# Patient Record
Sex: Male | Born: 2005 | Race: White | Hispanic: No | Marital: Single | Smoking: Never smoker
Health system: Southern US, Community
[De-identification: ages and names within clinical notes are randomized; demographics above are authoritative.]

## PROBLEM LIST (undated history)

## (undated) DIAGNOSIS — F909 Attention-deficit hyperactivity disorder, unspecified type: Secondary | ICD-10-CM

---
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
## 2023-10-03 ENCOUNTER — Emergency Department (HOSPITAL_COMMUNITY)
Admission: EM | Admit: 2023-10-03 | Discharge: 2023-10-03 | Disposition: A | Discharge status: Home / Self Care | Attending: Emergency Medicine | Admitting: Emergency Medicine

## 2023-10-03 ENCOUNTER — Other Ambulatory Visit: Payer: Self-pay

## 2023-10-03 ENCOUNTER — Emergency Department (HOSPITAL_COMMUNITY)

## 2023-10-03 ENCOUNTER — Encounter (HOSPITAL_COMMUNITY): Payer: Self-pay | Admitting: Emergency Medicine

## 2023-10-03 DIAGNOSIS — R251 Tremor, unspecified: Secondary | ICD-10-CM | POA: Insufficient documentation

## 2023-10-03 DIAGNOSIS — R21 Rash and other nonspecific skin eruption: Secondary | ICD-10-CM | POA: Insufficient documentation

## 2023-10-03 DIAGNOSIS — M79621 Pain in right upper arm: Secondary | ICD-10-CM | POA: Insufficient documentation

## 2023-10-03 DIAGNOSIS — M79604 Pain in right leg: Secondary | ICD-10-CM | POA: Diagnosis not present

## 2023-10-03 DIAGNOSIS — T754XXA Electrocution, initial encounter: Secondary | ICD-10-CM | POA: Diagnosis present

## 2023-10-03 LAB — CBC WITH DIFFERENTIAL/PLATELET
Abs Immature Granulocytes: 0.04 K/uL (ref 0.00–0.07)
Basophils Absolute: 0 K/uL (ref 0.0–0.1)
Basophils Relative: 0 %
Eosinophils Absolute: 0.1 K/uL (ref 0.0–0.5)
Eosinophils Relative: 2 %
HCT: 46.6 % (ref 39.0–52.0)
Hemoglobin: 15.5 g/dL (ref 13.0–17.0)
Immature Granulocytes: 1 %
Lymphocytes Relative: 33 %
Lymphs Abs: 2 K/uL (ref 0.7–4.0)
MCH: 28.3 pg (ref 26.0–34.0)
MCHC: 33.3 g/dL (ref 30.0–36.0)
MCV: 85 fL (ref 80.0–100.0)
Monocytes Absolute: 0.5 K/uL (ref 0.1–1.0)
Monocytes Relative: 9 %
Neutro Abs: 3.4 K/uL (ref 1.7–7.7)
Neutrophils Relative %: 55 %
Platelets: 203 K/uL (ref 150–400)
RBC: 5.48 MIL/uL (ref 4.22–5.81)
RDW: 12.3 % (ref 11.5–15.5)
WBC: 6.2 K/uL (ref 4.0–10.5)
nRBC: 0 % (ref 0.0–0.2)

## 2023-10-03 LAB — COMPREHENSIVE METABOLIC PANEL WITH GFR
ALT: 27 U/L (ref 0–44)
AST: 26 U/L (ref 15–41)
Albumin: 4.6 g/dL (ref 3.5–5.0)
Alkaline Phosphatase: 82 U/L (ref 38–126)
Anion gap: 12 (ref 5–15)
BUN: 17 mg/dL (ref 6–20)
CO2: 24 mmol/L (ref 22–32)
Calcium: 10 mg/dL (ref 8.9–10.3)
Chloride: 101 mmol/L (ref 98–111)
Creatinine, Ser: 0.91 mg/dL (ref 0.61–1.24)
GFR, Estimated: >60 mL/min (ref >60)
Glucose, Bld: 109 mg/dL — ABNORMAL HIGH (ref 70–99)
Potassium: 4.2 mmol/L (ref 3.5–5.1)
Sodium: 137 mmol/L (ref 135–145)
Total Bilirubin: 0.8 mg/dL (ref 0.0–1.2)
Total Protein: 8.3 g/dL — ABNORMAL HIGH (ref 6.5–8.1)

## 2023-10-03 LAB — CK: Total CK: 175 U/L (ref 49–397)

## 2023-10-03 MED ORDER — OXYCODONE-ACETAMINOPHEN 5-325 MG PO TABS: 1.0000 | ORAL_TABLET | Freq: Two times a day (BID) | ORAL | 0 refills | Status: DC | PRN

## 2023-10-03 MED ORDER — OXYCODONE-ACETAMINOPHEN 5-325 MG PO TABS
1.0000 | ORAL_TABLET | Freq: Once | ORAL | Status: AC
  Administered 2023-10-03: 1 via ORAL
  Filled 2023-10-03: qty 1

## 2023-10-03 MED ORDER — IBUPROFEN 600 MG PO TABS: 600.0000 mg | ORAL_TABLET | Freq: Four times a day (QID) | ORAL | 0 refills | Status: AC | PRN

## 2023-10-03 MED ORDER — LACTATED RINGERS IV BOLUS
1000.0000 mL | Freq: Once | INTRAVENOUS | Status: AC
  Administered 2023-10-03: 1000 mL via INTRAVENOUS

## 2023-10-03 MED ORDER — MORPHINE SULFATE (PF) 4 MG/ML IV SOLN: 6.0000 mg | INTRAVENOUS | Status: DC | PRN

## 2023-10-03 MED ORDER — BACITRACIN ZINC 500 UNIT/GM EX OINT: 1.0000 | TOPICAL_OINTMENT | Freq: Two times a day (BID) | CUTANEOUS | 0 refills | Status: AC
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
## 2023-10-03 NOTE — ED Notes (Signed)
 Pt care taken, waiting for results of x-rays, no other complaints.
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
## 2024-01-19 ENCOUNTER — Encounter (HOSPITAL_COMMUNITY): Payer: Self-pay | Admitting: Emergency Medicine

## 2024-01-19 ENCOUNTER — Emergency Department (HOSPITAL_COMMUNITY)

## 2024-01-19 ENCOUNTER — Emergency Department (HOSPITAL_COMMUNITY)
Admission: EM | Admit: 2024-01-19 | Discharge: 2024-01-19 | Disposition: A | Discharge status: Home / Self Care | Attending: Emergency Medicine | Admitting: Emergency Medicine

## 2024-01-19 ENCOUNTER — Other Ambulatory Visit: Payer: Self-pay

## 2024-01-19 DIAGNOSIS — W312XXA Contact with powered woodworking and forming machines, initial encounter: Secondary | ICD-10-CM | POA: Insufficient documentation

## 2024-01-19 DIAGNOSIS — S6992XA Unspecified injury of left wrist, hand and finger(s), initial encounter: Secondary | ICD-10-CM | POA: Diagnosis present

## 2024-01-19 DIAGNOSIS — Y93L1 Activity, splitting wood: Secondary | ICD-10-CM | POA: Insufficient documentation

## 2024-01-19 DIAGNOSIS — S62522B Displaced fracture of distal phalanx of left thumb, initial encounter for open fracture: Secondary | ICD-10-CM | POA: Insufficient documentation

## 2024-01-19 DIAGNOSIS — M7989 Other specified soft tissue disorders: Secondary | ICD-10-CM | POA: Insufficient documentation

## 2024-01-19 DIAGNOSIS — S68522A Partial traumatic transphalangeal amputation of left thumb, initial encounter: Secondary | ICD-10-CM

## 2024-01-19 DIAGNOSIS — S62523B Displaced fracture of distal phalanx of unspecified thumb, initial encounter for open fracture: Secondary | ICD-10-CM

## 2024-01-19 LAB — CBC
HCT: 45.9 % (ref 39.0–52.0)
Hemoglobin: 15.6 g/dL (ref 13.0–17.0)
MCH: 27.9 pg (ref 26.0–34.0)
MCHC: 34 g/dL (ref 30.0–36.0)
MCV: 82 fL (ref 80.0–100.0)
Platelets: 186 K/uL (ref 150–400)
RBC: 5.6 MIL/uL (ref 4.22–5.81)
RDW: 13 % (ref 11.5–15.5)
WBC: 8.7 K/uL (ref 4.0–10.5)
nRBC: 0 % (ref 0.0–0.2)

## 2024-01-19 LAB — BASIC METABOLIC PANEL WITH GFR
Anion gap: 14 (ref 5–15)
BUN: 20 mg/dL (ref 6–20)
CO2: 21 mmol/L — ABNORMAL LOW (ref 22–32)
Calcium: 9 mg/dL (ref 8.9–10.3)
Chloride: 104 mmol/L (ref 98–111)
Creatinine, Ser: 0.87 mg/dL (ref 0.61–1.24)
GFR, Estimated: >60 mL/min (ref >60)
Glucose, Bld: 100 mg/dL — ABNORMAL HIGH (ref 70–99)
Potassium: 4 mmol/L (ref 3.5–5.1)
Sodium: 139 mmol/L (ref 135–145)

## 2024-01-19 MED ORDER — POVIDONE-IODINE 10 % EX SOLN
CUTANEOUS | Status: AC
Start: 2024-01-19 — End: 2024-01-19
  Filled 2024-01-19: qty 29.6

## 2024-01-19 MED ORDER — OXYCODONE-ACETAMINOPHEN 5-325 MG PO TABS
1.0000 | ORAL_TABLET | Freq: Once | ORAL | Status: AC
  Administered 2024-01-19: 1 via ORAL
  Filled 2024-01-19: qty 1

## 2024-01-19 MED ORDER — CEPHALEXIN 500 MG PO CAPS: 500.0000 mg | ORAL_CAPSULE | Freq: Four times a day (QID) | ORAL | 0 refills | Status: AC

## 2024-01-19 MED ORDER — HYDROCODONE-ACETAMINOPHEN 5-325 MG PO TABS: 1.0000 | ORAL_TABLET | Freq: Four times a day (QID) | ORAL | 0 refills | Status: DC | PRN

## 2024-01-19 MED ORDER — TETANUS-DIPHTH-ACELL PERTUSSIS 5-2-15.5 LF-MCG/0.5 IM SUSP: 0.5000 mL | Freq: Once | INTRAMUSCULAR | Status: DC

## 2024-01-19 MED ORDER — CEFAZOLIN SODIUM-DEXTROSE 2-4 GM/100ML-% IV SOLN
2.0000 g | Freq: Once | INTRAVENOUS | Status: AC
Start: 2024-01-19 — End: 2024-01-19
  Administered 2024-01-19: 2 g via INTRAVENOUS
  Filled 2024-01-19: qty 100

## 2024-01-19 MED ORDER — LIDOCAINE HCL (PF) 1 % IJ SOLN
20.0000 mL | Freq: Once | INTRAMUSCULAR | Status: AC
  Administered 2024-01-19: 20 mL
  Filled 2024-01-19: qty 20

## 2024-01-19 NOTE — ED Provider Notes (Incomplete)
 [place] EMERGENCY DEPARTMENT AT [facility] Provider Note   CSN: [id number] Arrival date & time: 01/19/24  1637     Patient presents with: Laceration  HPI Martin Potter is a 18 y.o. male present for left laceration.  Occurred around 4:30 PM today.  Patient was using a wood splitter when he cut the tip of my thumb off.  Bleeding controlled at this time.  Endorses in pain but no loss of sensation.  He states that range of motion is still intact as well.    Laceration      Prior to Admission medications   Medication Sig Start Date End Date Taking? Authorizing Provider  cephALEXin (KEFLEX) 500 MG capsule Take 1 capsule (500 mg total) by mouth 4 (four) times daily for 7 days. 01/19/24 01/26/24 Yes Lang, Olga Bourbeau K, PA-C  HYDROcodone-acetaminophen  (NORCO/VICODIN) 5-325 MG tablet Take 1 tablet by mouth every 6 (six) hours as needed for up to 8 doses. 01/19/24  Yes Casten Floren K, PA-C  bacitracin  ointment Apply 1 Application topically 2 (two) times daily. 10/03/23   Charlyn Sora, MD  ibuprofen  (ADVIL ) 600 MG tablet Take 1 tablet (600 mg total) by mouth every 6 (six) hours as needed. 10/03/23   Charlyn Sora, MD  oxyCODONE -acetaminophen  (PERCOCET/ROXICET) 5-325 MG tablet Take 1 tablet by mouth every 12 (twelve) hours as needed for severe pain (pain score 7-10). 10/03/23   Charlyn Sora, MD    Allergies: Patient has no known allergies.    Review of Systems See HPI  Updated Vital Signs BP (!) 140/85   Pulse 95   Temp (!) 97.2 F (36.2 C) (Temporal)   Resp 18   SpO2 99%   Physical Exam Constitutional:      Appearance: Normal appearance.  HENT:     Head: Normocephalic.     Nose: Nose normal.  Eyes:     Conjunctiva/sclera: Conjunctivae normal.  Pulmonary:     Effort: Pulmonary effort is normal.  Musculoskeletal:     Comments: Appears to be partial amputation of the left great thumb.  Distal tip of the thumb appears to be avulsed.  Not actively bleeding.   Able to actively range the thumb at the [facility] joint. See pictures below. Radial pulse is 2+.  Thumbnail is absent but the rim of the proximal nailbed is palpable.  Neurological:     Mental Status: He is alert.  Psychiatric:        Mood and Affect: Mood normal.      Post irrigation and cleaning      (all labs ordered are listed, but only abnormal results are displayed) Labs Reviewed  BASIC METABOLIC PANEL WITH GFR - Abnormal; Notable for the following components:      Result Value   CO2 21 (*)    Glucose, Bld 100 (*)    All other components within normal limits  CBC    EKG: None  Radiology: DG Finger Thumb Left Result Date: 01/19/2024 CLINICAL DATA:  Laceration EXAM: LEFT THUMB 2+V COMPARISON:  None Available. FINDINGS: Partial soft tissue and bony amputation involving the tuft of the first distal phalanx. Acute comminuted fracture involving the mid to distal first distal phalanx with displaced bone fragments. IMPRESSION: Partial soft tissue and bony amputation involving the tuft of the first distal phalanx. Acute comminuted open fracture involving the mid to distal first distal phalanx with displaced bone fragments. Electronically Signed   By: Luke Bun M.D.   On: 01/19/2024 17:34     .  Nerve Block  Date/Time: 01/20/2024 9:13 AM  Performed by: Lang Norleen POUR, PA-C Authorized by: Lang Norleen POUR, PA-C   Consent:    Consent obtained:  Verbal   Consent given by:  Patient   Risks discussed:  Allergic reaction, infection and nerve damage   Alternatives discussed:  No treatment Universal protocol:    Procedure explained and questions answered to patient or proxy's satisfaction: yes     Relevant documents present and verified: yes     Test results available: yes     Patient identity confirmed:  Verbally with patient and arm band Indications:    Indications:  Pain relief and procedural anesthesia Location:    Body area:  Upper extremity   Upper extremity nerve:   Metacarpal (left thumb)   Laterality:  Right Pre-procedure details:    Skin preparation:  Povidone-iodine Skin anesthesia:    Skin anesthesia method:  Local infiltration   Local anesthetic:  Lidocaine 1% w/o epi Procedure details:    Block needle gauge:  25 G   Anesthetic injected:  Lidocaine 1% w/o epi   Steroid injected:  None   Injection procedure:  Anatomic landmarks identified Post-procedure details:    Dressing:  Sterile dressing   Outcome:  Anesthesia achieved   Procedure completion:  Tolerated well, no immediate complications    Medications Ordered in the ED  oxyCODONE -acetaminophen  (PERCOCET/ROXICET) 5-325 MG per tablet 1 tablet (1 tablet Oral Given 01/19/24 1723)  lidocaine (PF) (XYLOCAINE) 1 % injection 20 mL (20 mLs Infiltration Given by Other 01/19/24 1906)  povidone-iodine (BETADINE) 10 % external solution (  Given 01/19/24 1801)  ceFAZolin (ANCEF) IVPB 2g/100 mL premix (0 g Intravenous Stopped 01/19/24 1852)                                    Medical Decision Making Amount and/or Complexity of Data Reviewed Labs: ordered. Radiology: ordered.  Risk Prescription drug management.   18 year old well-appearing male presenting for left thumb injury.  Exam concerning for partial amputation of the distal left thumb and questionable open fracture.  X-ray revealed partial soft tissue and bony amputation involving the tuft of the first distal phalanx. Acute comminuted open fracture involving the mid to distal first distal phalanx with displaced bone fragments.  Discussed patient with Dr. Onesimo, on-call physician for hand, advised to irrigate and clean and give 2 g of Ancef followed by initiation of p.o. antibiotics. Advised to cover with Xeroform gauze and he stated that he would see him in the clinic this coming Tuesday.  Administered digital block at the base of the left thumb, anesthesia was achieved.  Copiously cleaned and irrigated and then applied bandage as directed.   Gave the Ancef.   Discussed follow-up plan with the patient and he was agreeable.  Discussed return precautions as well.  Sent Keflex to his pharmacy and Norco and also recommended ibuprofen .  Discharged in good condition.     Final diagnoses:  Partial traumatic amputation of left thumb through phalanx, initial encounter  Open fracture of tuft of distal phalanx of thumb    ED Discharge Orders          Ordered    HYDROcodone-acetaminophen  (NORCO/VICODIN) 5-325 MG tablet  Every 6 hours PRN        01/19/24 1857    cephALEXin (KEFLEX) 500 MG capsule  4 times daily        01/19/24 1857  Lang Norleen POUR, PA-C 01/20/24 1027    Franklyn Sid SAILOR, MD 01/20/24 [phone]

## 2024-01-19 NOTE — Discharge Instructions (Addendum)
 Evaluation today revealed you have a partial amputation of the tip of the left thumb.  Please keep the thumb covered with a bandage and maintain the finger splint in place until you are evaluated by hand surgery.  Dr. Onesimo has agreed to see you in his clinic on Tuesday.  Please call his office to confirm that appointment tomorrow.  I sent antibiotics and pain medicine to your pharmacy.

## 2024-01-19 NOTE — ED Notes (Signed)
 Writer placed xeroform, gauze, and kerlex wrap around patient's finger with finger splint.

## 2024-01-19 NOTE — ED Notes (Signed)
 Pt is soaking hand in betadine and sterile water solution

## 2024-01-19 NOTE — ED Triage Notes (Signed)
 Pt cut left thumb with wood splitter pta. Ambulatory. Bleeding controlled. Large amount of the tip of thumb is absent. . Can wiggle thumb

## 2024-01-25 ENCOUNTER — Encounter: Payer: Self-pay | Admitting: Orthopedic Surgery

## 2024-01-25 ENCOUNTER — Ambulatory Visit (INDEPENDENT_AMBULATORY_CARE_PROVIDER_SITE_OTHER): Admitting: Orthopedic Surgery

## 2024-01-25 DIAGNOSIS — S62523B Displaced fracture of distal phalanx of unspecified thumb, initial encounter for open fracture: Secondary | ICD-10-CM

## 2024-01-25 DIAGNOSIS — S61112A Laceration without foreign body of left thumb with damage to nail, initial encounter: Secondary | ICD-10-CM

## 2024-01-25 DIAGNOSIS — S62522B Displaced fracture of distal phalanx of left thumb, initial encounter for open fracture: Secondary | ICD-10-CM

## 2024-01-25 MED ORDER — OXYCODONE HCL 5 MG PO TABS: 5.0000 mg | ORAL_TABLET | Freq: Four times a day (QID) | ORAL | 0 refills | Status: DC | PRN

## 2024-01-25 NOTE — Progress Notes (Signed)
 New Patient Visit  Assessment: Martin Potter is a 18 y.o. male with the following: 1. Laceration of left thumb without foreign body with damage to nail, initial encounter 2. Open fracture of tuft of distal phalanx of thumb  Plan: Martin Potter sustained a laceration to the distal aspect of the left thumb, which does involve damage to the nailbed.  Injury was sustained a few days ago while using a wood splitter.  He also sustained a partial amputation and fracture of the distal phalanx.  Currently, tissue is healthy appearing.  He is complaining of pain.  Provided an updated prescription for pain medications.  Advised him to continue to take the antibiotics to completion.  Daily dressing changes.  Elevate the hand out with swelling.  Follow-up in 1 week for wound check.  Follow-up: Return in about 1 week (around 02/01/2024).  Subjective:  Chief Complaint  Patient presents with   Hand Injury    L thumb partial amputation after being hit w a wood splitter DOI 01/19/24    History of Present Illness: Martin Potter is a 18 y.o. male who presents for evaluation of left thumb pain.  He is right-hand dominant.  He was at work, and injured the left thumb while using a wood splitter.  He had immediate pain.  He was evaluated the emergency department.  He was noted to have a laceration to the distal aspect of the thumb, with partial amputation of the distal phalanx.  There is a fracture of the distal phalanx.  He has been taken antibiotics.  He is given pain medicines, but states that he continues to have pain.  He has not changed his dressing.  He has been in a splint protecting the thumb.   Review of Systems: No fevers or chills No numbness or tingling No chest pain No shortness of breath No bowel or bladder dysfunction No GI distress No headaches   Medical History:  Past Medical History:  Diagnosis Date   ADHD     No past surgical history on file.  No family history on  file. Social History   Tobacco Use   Smoking status: Never   Smokeless tobacco: Never  Substance Use Topics   Alcohol use: Not Currently   Drug use: Not Currently    No Known Allergies  Current Meds  Medication Sig   bacitracin  ointment Apply 1 Application topically 2 (two) times daily.   cephALEXin (KEFLEX) 500 MG capsule Take 1 capsule (500 mg total) by mouth 4 (four) times daily for 7 days.   ibuprofen  (ADVIL ) 600 MG tablet Take 1 tablet (600 mg total) by mouth every 6 (six) hours as needed.   oxyCODONE  (ROXICODONE ) 5 MG immediate release tablet Take 1 tablet (5 mg total) by mouth every 6 (six) hours as needed for up to 7 days.    Objective: There were no vitals taken for this visit.  Physical Exam:  General: Alert and oriented. and No acute distress. Gait: Normal gait.  Left thumb with a laceration extending from the nailbed until the volar pulp.  There is no exposed bone currently.  No concerns for an infection.  The thumb is well-perfused.  He has tenderness to palpation and swelling distally.  IMAGING: I personally reviewed images previously obtained from the ED   X-rays from the emergency department were available in clinic today.  Fracture of the distal phalanx, with amputation of the distal tuft.   New Medications:  Meds ordered this encounter  Medications  oxyCODONE  (ROXICODONE ) 5 MG immediate release tablet    Sig: Take 1 tablet (5 mg total) by mouth every 6 (six) hours as needed for up to 7 days.    Dispense:  20 tablet    Refill:  0      Oneil DELENA Horde, MD  01/25/2024 10:31 AM

## 2024-01-25 NOTE — Patient Instructions (Addendum)
 Continue to take antibiotics and told the prescription is complete  Change dressing once daily  Medications as needed.  Consider taking ibuprofen  or naproxen.  Elevate the hand to help with swelling

## 2024-02-01 ENCOUNTER — Encounter: Payer: Self-pay | Admitting: Orthopedic Surgery

## 2024-02-01 ENCOUNTER — Ambulatory Visit (INDEPENDENT_AMBULATORY_CARE_PROVIDER_SITE_OTHER): Admitting: Orthopedic Surgery

## 2024-02-01 DIAGNOSIS — S62522B Displaced fracture of distal phalanx of left thumb, initial encounter for open fracture: Secondary | ICD-10-CM | POA: Diagnosis not present

## 2024-02-01 DIAGNOSIS — S61112D Laceration without foreign body of left thumb with damage to nail, subsequent encounter: Secondary | ICD-10-CM | POA: Diagnosis not present

## 2024-02-01 DIAGNOSIS — S62523B Displaced fracture of distal phalanx of unspecified thumb, initial encounter for open fracture: Secondary | ICD-10-CM

## 2024-02-01 MED ORDER — OXYCODONE HCL 5 MG PO TABS: 5.0000 mg | ORAL_TABLET | Freq: Four times a day (QID) | ORAL | 0 refills | Status: AC | PRN

## 2024-02-01 NOTE — Patient Instructions (Signed)
 Daily dressing changes  Elevate the hand help with swelling  Medication as needed  Contact the clinic if you start to develop fevers, chills, worsening pain or you noticed pus in the wound.

## 2024-02-02 NOTE — Progress Notes (Signed)
 Return patient Visit  Assessment: Martin Potter is a 18 y.o. male with the following: 1. Laceration of left thumb without foreign body with damage to nail, subsequent encounter 2. Open fracture of tuft of distal phalanx of thumb  Plan: Martin Potter sustained a laceration to the distal aspect of the left thumb, which does involve damage to the nailbed.  Overall, Martin Potter is doing well.  His thumb is healthy appearing.  There is granulation tissue.  No purulence.  Continues to have pain.  Continue daily dressing changes.  Monitor for signs of infection.  Elevate to help with swelling.  Follow-up in 1 week for wound check.  We will obtain new x-ray at that time.  Follow-up: Return in about 1 week (around 02/08/2024).  Subjective:  Chief Complaint  Patient presents with   Wound Check    L thumb DOI 01/19/24    History of Present Illness: Martin Potter is a 18 y.o. male who returns for evaluation of left thumb pain.  Martin Potter sustained a laceration to the distal aspect of the left thumb, with an open tuft fracture.  Injury was just over a week ago.  Martin Potter has been completing daily dressing changes.  Martin Potter is completed antibiotics.  Martin Potter continues to have pain.  No fevers or chills.  No drainage is appreciated.  Review of Systems: No fevers or chills No numbness or tingling No chest pain No shortness of breath No bowel or bladder dysfunction No GI distress No headaches    Objective: There were no vitals taken for this visit.  Physical Exam:  General: Alert and oriented. and No acute distress. Gait: Normal gait.  Maceration of the distal aspect of left thumb is healthy appearing.  No purulence.  No surrounding erythema.  Martin Potter has appropriate tenderness.  There is some residual swelling.  Sensation intact in the radial and ulnar aspect of the thumb.   IMAGING: No new imaging obtained today   New Medications:  Meds ordered this encounter  Medications   oxyCODONE  (ROXICODONE ) 5 MG immediate  release tablet    Sig: Take 1 tablet (5 mg total) by mouth every 6 (six) hours as needed for up to 7 days.    Dispense:  20 tablet    Refill:  0      Martin DELENA Horde, MD  02/02/2024 10:51 AM

## 2024-02-08 ENCOUNTER — Other Ambulatory Visit: Payer: Self-pay

## 2024-02-08 ENCOUNTER — Ambulatory Visit (INDEPENDENT_AMBULATORY_CARE_PROVIDER_SITE_OTHER): Admitting: Orthopedic Surgery

## 2024-02-08 ENCOUNTER — Encounter: Payer: Self-pay | Admitting: Orthopedic Surgery

## 2024-02-08 DIAGNOSIS — S62523B Displaced fracture of distal phalanx of unspecified thumb, initial encounter for open fracture: Secondary | ICD-10-CM

## 2024-02-08 DIAGNOSIS — S62522B Displaced fracture of distal phalanx of left thumb, initial encounter for open fracture: Secondary | ICD-10-CM

## 2024-02-08 DIAGNOSIS — S61112D Laceration without foreign body of left thumb with damage to nail, subsequent encounter: Secondary | ICD-10-CM

## 2024-02-08 NOTE — Patient Instructions (Signed)
 Continue with daily dressing changes  Monitor for signs or symptoms of an infection

## 2024-02-09 NOTE — Progress Notes (Signed)
 Return patient Visit  Assessment: Martin Potter is a 18 y.o. male with the following: 1. Laceration of left thumb without foreign body with damage to nail, subsequent encounter 2. Open fracture of tuft of distal phalanx of thumb  Plan: [name] sustained a laceration to the distal aspect of the left thumb, which does involve damage to the nailbed.  He continues to do well. Laceration is healthy.  There is granulation tissue.  No signs of infection.  Bone is not exposed.  XR are stable.  Continue with daily dressing changes.  Follow up in 2 weeks.   Follow-up: Return in about 2 weeks (around 02/22/2024).  Subjective:  Chief Complaint  Patient presents with   Laceration    Follow up left thumb injury/ improving     History of Present Illness: Martin Potter is a 18 y.o. male who returns for evaluation of left thumb pain.  he sustained a laceration to the distal aspect of the left thumb, with an open tuft fracture.  Injury was 2 weeks ago.  He is doing well.  Pain is controlled.  No fevers.  He has been changing his dressing daily.  He has completed antibiotics.   Review of Systems: No fevers or chills No numbness or tingling No chest pain No shortness of breath No bowel or bladder dysfunction No GI distress No headaches    Objective: There were no vitals taken for this visit.  Physical Exam:  General: Alert and oriented. and No acute distress. Gait: Normal gait.  Laceration is healthy appearing.  There is beefy red granulation tissue over the nail bed.  No bone is exposed.  He has sensation to the radial and ulnar thumb.  Some stiffness at IP joint.    IMAGING: I personally ordered and reviewed the following images  XR of the left thumb was obtained in clinic today.  These are compared to prior XR.  No change in alignment.  There is loss of the distal tip of the distal phalanx.  Minimally displaced fracture of the distal phalanx with some comminution.  Soft  tissue appears to be covering the distal phalanx.  No additional injuries.   Impression:  Left thumb with comminuted distal phalanx fracture with amputation of distal tip    New Medications:  No orders of the defined types were placed in this encounter.     Oneil DELENA Horde, MD  02/09/2024 11:33 PM

## 2024-02-22 ENCOUNTER — Encounter: Payer: Self-pay | Admitting: Orthopedic Surgery

## 2024-02-22 ENCOUNTER — Ambulatory Visit: Admitting: Orthopedic Surgery

## 2024-02-22 DIAGNOSIS — S62523B Displaced fracture of distal phalanx of unspecified thumb, initial encounter for open fracture: Secondary | ICD-10-CM

## 2024-02-22 DIAGNOSIS — S61112D Laceration without foreign body of left thumb with damage to nail, subsequent encounter: Secondary | ICD-10-CM

## 2024-02-22 NOTE — Progress Notes (Signed)
 Return patient Visit  Assessment: Martin Potter is a 18 y.o. male with the following: 1. Laceration of left thumb without foreign body with damage to nail, subsequent encounter 2. Open fracture of tuft of distal phalanx of thumb  Plan: Martin Potter sustained a laceration to the distal aspect of the left thumb, which does involve damage to the nailbed.  Laceration continues to improve.  He is no longer covering his thumb.  Significant improvement in the overall appearance, with greater than 50% reduction of the scabbing.  He does continue to have some tenderness to the tip of the thumb.  Unclear if the nail will recover.  This was discussed with him in clinic today.  He states understanding.  He has returned to work, and will gradually increase the level of activity.  He will contact the clinic if he has any further issues.  Follow-up: Return if symptoms worsen or fail to improve.  Subjective:  Chief Complaint  Patient presents with   Hand Injury    L thumb DOI 01/19/24    History of Present Illness: Martin Potter is a 18 y.o. male who returns for evaluation of left thumb pain.  he sustained a laceration to the distal aspect of the left thumb, with an open tuft fracture approximately 5 weeks ago.  He has done very well.  He is no longer completing dressing changes.  He has no drainage.  Scabbing has significantly improved, with greater than 50% reduction in overall appearance.  He has some tenderness over the distal thumb.  He bumped it yesterday, and noted a small amount of bleeding.  He has returned to work, with limited activity.   Review of Systems: No fevers or chills No numbness or tingling No chest pain No shortness of breath No bowel or bladder dysfunction No GI distress No headaches    Objective: There were no vitals taken for this visit.  Physical Exam:  General: Alert and oriented. and No acute distress. Gait: Normal gait.  Laceration is healthy appearing.   Small amount of healthy appearing scab, on the dorsal aspect of the thumb, within the nailbed.  Early new nail formation is apparent.  Some tenderness to the distal extent of the thumb.  He has good motion in the left thumb otherwise.   IMAGING: I personally ordered and reviewed the following images  No new imaging obtained today.  New Medications:  No orders of the defined types were placed in this encounter.     Oneil DELENA Horde, MD  02/22/2024 11:59 AM

## 2024-03-23 ENCOUNTER — Ambulatory Visit: Admitting: Orthopedic Surgery
